# Patient Record
Sex: Male | Born: 1965 | Race: Black or African American | Hispanic: No | Marital: Married | Smoking: Never smoker
Health system: Southern US, Community
[De-identification: ages and names within clinical notes are randomized; demographics above are authoritative.]

## PROBLEM LIST (undated history)

## (undated) DIAGNOSIS — I10 Essential (primary) hypertension: Secondary | ICD-10-CM

## (undated) HISTORY — PX: COLONOSCOPY: SHX174

---

## 2000-01-11 ENCOUNTER — Emergency Department (HOSPITAL_COMMUNITY): Admission: EM | Admit: 2000-01-11 | Discharge: 2000-01-11 | Payer: Self-pay | Admitting: Emergency Medicine

## 2000-04-20 ENCOUNTER — Ambulatory Visit (HOSPITAL_COMMUNITY): Admission: RE | Admit: 2000-04-20 | Discharge: 2000-04-20 | Payer: Self-pay | Admitting: Internal Medicine

## 2001-03-15 ENCOUNTER — Emergency Department (HOSPITAL_COMMUNITY): Admission: EM | Admit: 2001-03-15 | Discharge: 2001-03-15 | Payer: Self-pay | Admitting: Emergency Medicine

## 2001-08-09 ENCOUNTER — Inpatient Hospital Stay (HOSPITAL_COMMUNITY): Admission: EM | Admit: 2001-08-09 | Discharge: 2001-08-09 | Payer: Self-pay | Admitting: Psychiatry

## 2001-08-10 ENCOUNTER — Emergency Department (HOSPITAL_COMMUNITY): Admission: EM | Admit: 2001-08-10 | Discharge: 2001-08-11 | Payer: Self-pay | Admitting: Emergency Medicine

## 2001-08-11 ENCOUNTER — Encounter: Payer: Self-pay | Admitting: Emergency Medicine

## 2001-11-04 ENCOUNTER — Emergency Department (HOSPITAL_COMMUNITY): Admission: EM | Admit: 2001-11-04 | Discharge: 2001-11-04 | Payer: Self-pay | Admitting: Emergency Medicine

## 2002-02-16 ENCOUNTER — Emergency Department (HOSPITAL_COMMUNITY): Admission: EM | Admit: 2002-02-16 | Discharge: 2002-02-16 | Payer: Self-pay | Admitting: Emergency Medicine

## 2002-05-21 ENCOUNTER — Ambulatory Visit (HOSPITAL_BASED_OUTPATIENT_CLINIC_OR_DEPARTMENT_OTHER): Admission: RE | Admit: 2002-05-21 | Discharge: 2002-05-21 | Payer: Self-pay | Admitting: Urology

## 2003-12-08 ENCOUNTER — Emergency Department (HOSPITAL_COMMUNITY): Admission: EM | Admit: 2003-12-08 | Discharge: 2003-12-08 | Payer: Self-pay | Admitting: Emergency Medicine

## 2004-01-04 ENCOUNTER — Emergency Department (HOSPITAL_COMMUNITY): Admission: EM | Admit: 2004-01-04 | Discharge: 2004-01-04 | Payer: Self-pay | Admitting: Emergency Medicine

## 2007-06-22 ENCOUNTER — Emergency Department (HOSPITAL_COMMUNITY): Admission: EM | Admit: 2007-06-22 | Discharge: 2007-06-22 | Payer: Self-pay | Admitting: Emergency Medicine

## 2011-04-21 NOTE — Discharge Summary (Signed)
[facility]  Patient:    Edward Mckinney, Edward Mckinney Visit Number: [id number] MRN: [id number]          Service Type: PSY Location: 50 [id number] Attending Physician:  Jeanice Lim Dictated by:   Young Berry Scott, N.P. Admit Date:  08/09/2001 Discharge Date: 08/09/2001                             Discharge Summary  This is both the psychiatric admission assessment and the discharge summary as the patient was admitted and discharged the same day.  DATE OF ASSESSMENT:  August 09, 2001 at 11 a.m.  IDENTIFYING INFORMATION:  This is a 45 year old African-American male, who is single.  He is an involuntary commitment after an accidental overdose of four Zoloft and four Excedrin.  HISTORY OF PRESENT ILLNESS:  The patient reports that he has recently been under stress, finding out about his fiances involvement with another man.  He was upset and crying yesterday and reports that he took an accidental overdose of four Zoloft that were not his own tablets and also four Excedrin over a 2-1/2 hour period in order to get himself calmed down and go to sleep.  He admits that he was upset about having to decide to postpone his wedding but he categorically denies that he had any suicidal thoughts, either at that time or now.  A friend had been talking to him on the phone and knew that he was upset and went over to his house to comfort him.  When the friend arrived, the friend discovered that he was a bit groggy.  The friend was unclear about what was happening and subsequently called EMS.  EMS stated that the patient had reported suicidal thoughts to them.  However, the patient, today, categorically denies this and insists that he has never had any suicidal thoughts at any time.  The patients fiance was also able to corroborate his story that he has never had any history of self-harm and has shown no signs of depression or expressed any suicidal thoughts.  The patient  denies any symptoms of depression, although he has had some difficulty getting to sleep. Recently, his appetite is good and he is functioning well at work.  PAST PSYCHIATRIC HISTORY:  The patient has no psychiatric history.  No history of self-harm behaviors.  No history of substance abuse.  SOCIAL HISTORY:  The patient is divorced.  He currently lives home alone.  He is working two jobs and was living with his fiance until recent change in their relationship.  Apparently, the patients fiance has become involved with another man, according to the record.  FAMILY HISTORY:  Unclear.  ALCOHOL/DRUG HISTORY:  None.  MEDICAL HISTORY:  The patient is followed by Dr. Karilyn Cota, who is his primary care physician.  The patient denies any medical problems.  MEDICATIONS:  None.  DRUG ALLERGIES:  None.  POSITIVE PHYSICAL FINDINGS:  The patient was seen and medically cleared in the [facility] and, at that time, it is noted that his drug screen was negative for any substances.  The patients hemoglobin and hematocrit and electrolytes were within normal limits.  His acetaminophen level was 17.3 and his salicylate level was less than 4.  His vital signs were within normal limits.  MENTAL STATUS EXAMINATION:  This is a pleasant and cooperative, African-American male, who is alert.  His affect is full range and appropriate.  Speech is normal in pace and tone and is relevant.  Mood is euthymic.  Thought process is logical and coherent.  There are no dangerous ideations present.  The patient is goal-oriented in getting back to work and fulfilling his responsibilities today.  No evidence delusions.  Cognitively, he is intact and oriented x 3.  Judgment is intact.  Insight is intact.  DIAGNOSES: Axis I:    Adjustment disorder with anxious features. Axis II:   None. Axis III:  None. Axis IV:   Deferred. Axis V:    Current 66; past year 59.  PLAN:  The patient has been evaluated and  we have determined that he is not in any way suicidal or homicidal.  He is able to categorically promise safety in the community.  The plan is to discharge him to home to himself.  There is no follow-up scheduled at this time.  There are no medications.  Follow-up is required.  The patient is instructed, at this time, to report to his primary care physician or to hospital emergency room if any thoughts of self-harm should occur or if he has any symptoms of depression or other problems occur. At this point, the patient is safe to be released to the community to his own responsibility.  The diagnoses, as already stated, are both admission and discharge diagnoses. Dictated by:   Young Berry Scott, N.P. Attending Physician:  Jeanice Lim DD:  08/09/01 TD:  08/09/01 Job: [id number]

## 2011-04-21 NOTE — Op Note (Signed)
[facility]  Patient:    ARISH, REDNER Visit Number: [id number] MRN: [id number]          Service Type: NES Location: NESC Attending Physician:  Katherine Roan Dictated by:   Rozanna Boer., M.D. Proc. Date: 05/21/02 Admit Date:  05/21/2002                             Operative Report  PREOPERATIVE DIAGNOSIS:  Deep bulbous urethral stricture.  POSTOPERATIVE DIAGNOSIS:  Deep bulbous urethral stricture.  OPERATION:  Visual internal urethrotomy.  ANESTHESIA:  General.  SURGEON:  Rozanna Boer., M.D.  BRIEF HISTORY:  This is a 45 year old black male who was admitted with terminal hematuria, none for a year.  Office cystoscopy showed a tight urethral stricture and a deep bulbous urethra and he enters for incision at this time.  There is no history of trauma as a child or history of venereal disease, and the etiology of the stricture is unclear.  DESCRIPTION OF PROCEDURE:  The patient was placed on the operating table in the dorsolithotomy position after satisfactory induction of general anesthesia.  He was prepped and draped with Betadine in the usual sterile fashion.  The visual urethrotome was passed down to the deep bulbous urethra where the stricture was seen and photographed.  A 3-French ureteral catheter went through this which just about filled up the lumen.  Then, with the cold knife, the stricture was opened at 12 oclock.  This was about 2 cm distal to the external sphincter.  Once the stricture was incised, I could pass the scope into the bladder without difficulty.  The bladder was inspected and found to be free of any mucosal lesions, mild trabeculated was noted.  Coming out, I incised a little bit more at 12 oclock.  The stricture was less than 1 cm in length and seemed to be wide open at the end of the procedure.  I removed the scope and passed a #22 Foley catheter without difficulty.  There was  no bleeding and the catheter was left to straight drainage and then attached to a leg bag.  A B&O suppository was inserted and the patient taken to the recovery room in good condition with detailed written instructions, and will have the catheter removed in one weeks time. Dictated by:   Rozanna Boer., M.D. Attending Physician:  Katherine Roan DD:  05/21/02 TD:  05/22/02 Job: [id number]

## 2011-04-21 NOTE — Discharge Summary (Signed)
[facility]  Patient:    Edward Mckinney, Edward Mckinney Visit Number: [id number] MRN: [id number]          Service Type: EMS Location: ED Attending Physician:  Shelba Flake Dictated by:   Jeanice Lim, M.D. Admit Date:  08/10/2001 Discharge Date: 08/11/2001                             Discharge Summary  NO DICTATION Dictated by:   Jeanice Lim, M.D. Attending Physician:  Shelba Flake DD:  09/18/01 TD:  09/19/01 Job: [id number]

## 2011-09-18 LAB — POCT CARDIAC MARKERS
CKMB, poc: <1 — ABNORMAL LOW
CKMB, poc: <1 — ABNORMAL LOW
Myoglobin, poc: 51.7
Operator id: 189501
Troponin i, poc: <0.05
Troponin i, poc: <0.05

## 2011-09-18 LAB — I-STAT 8, (EC8 V) (CONVERTED LAB)
Acid-base deficit: 1
Chloride: 106
HCT: 44
Operator id: 189501
TCO2: 25
pCO2, Ven: 38.8 — ABNORMAL LOW
pH, Ven: 7.392 — ABNORMAL HIGH

## 2012-12-16 ENCOUNTER — Encounter (HOSPITAL_COMMUNITY): Payer: Self-pay | Admitting: Emergency Medicine

## 2012-12-16 ENCOUNTER — Emergency Department (HOSPITAL_COMMUNITY)
Admission: EM | Admit: 2012-12-16 | Discharge: 2012-12-16 | Disposition: A | Discharge status: Home / Self Care | Payer: 59 | Attending: Emergency Medicine | Admitting: Emergency Medicine

## 2012-12-16 DIAGNOSIS — Z79899 Other long term (current) drug therapy: Secondary | ICD-10-CM | POA: Insufficient documentation

## 2012-12-16 DIAGNOSIS — D649 Anemia, unspecified: Secondary | ICD-10-CM

## 2012-12-16 DIAGNOSIS — R030 Elevated blood-pressure reading, without diagnosis of hypertension: Secondary | ICD-10-CM | POA: Insufficient documentation

## 2012-12-16 LAB — CBC WITH DIFFERENTIAL/PLATELET
Basophils Relative: 0 % (ref 0–1)
Eosinophils Relative: 4 % (ref 0–5)
Hemoglobin: 8.1 g/dL — ABNORMAL LOW (ref 13.0–17.0)
Lymphocytes Relative: 37 % (ref 12–46)
Monocytes Absolute: 0.4 10*3/uL (ref 0.1–1.0)
Monocytes Relative: 6 % (ref 3–12)
Neutrophils Relative %: 53 % (ref 43–77)
Platelets: 157 10*3/uL (ref 150–400)
RBC: 4.63 MIL/uL (ref 4.22–5.81)
WBC: 6.2 10*3/uL (ref 4.0–10.5)

## 2012-12-16 LAB — BASIC METABOLIC PANEL
CO2: 23 mEq/L (ref 19–32)
Calcium: 9.6 mg/dL (ref 8.4–10.5)
Chloride: 101 mEq/L (ref 96–112)
Potassium: 5.1 mEq/L (ref 3.5–5.1)
Sodium: 135 mEq/L (ref 135–145)

## 2012-12-16 LAB — URINALYSIS, ROUTINE W REFLEX MICROSCOPIC
Glucose, UA: NEGATIVE mg/dL
Leukocytes, UA: NEGATIVE
Protein, ur: NEGATIVE mg/dL
Specific Gravity, Urine: 1.015 (ref 1.005–1.030)

## 2012-12-16 NOTE — ED Notes (Signed)
Pt reports that his BP has been high for 3 weeks. 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 Pt has never been on BP meds. Reports intermittent headache in the past month. Pt in NAD. Skin warm and dry. Denies Chest Pain.

## 2012-12-16 NOTE — ED Provider Notes (Signed)
History  This chart was scribed for Arthor Captain, PA-C working with Richardean Canal, MD by Shari Heritage, ED Scribe. This patient was seen in room WTR5/WTR5 and the patient's care was started at 1621.   CSN: [id number]  Arrival date & time 12/16/12  1345   First MD Initiated Contact with Patient 12/16/12 1621      Chief Complaint  Patient presents with  . Headache     The history is provided by the patient. No language interpreter was used.    HPI Comments: Edward Mckinney is a 47 y.o. male who presents to the Emergency Department complaining of intermittent, mild to moderate, pressure-like headache behind the eyes onset a few weeks ago. Patient is also concerned about his blood pressure levels. Patient denies blurry vision, double vision or loss of peripheral vision. No chest pain or shortness of breath. Patient says that he is having difficulty reading fine print. He also states that he gets dizzy sometimes when headache pain comes on. 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 He then went to an urgent care center today prior to arrival and it measured 154/88. These measurements were taken from the same arm. Patient has never been diagnosed with hypertension and is not on any BP medications. He does not have a PCP and states that he usually gets check ups at the [place]. Family history of hypertension (mother and sister), diabetes (sister). Patient's family has no known history of stroke or heart attack.   History reviewed. No pertinent past medical history.  History reviewed. No pertinent past surgical history.  No family history on file.  History  Substance Use Topics  . Smoking status: Not on file  . Smokeless tobacco: Not on file  . Alcohol Use: Not on file      Review of Systems A complete 10 system review of systems was  obtained and all systems are negative except as noted in the HPI and PMH.   Allergies  Penicillins  Home Medications   Current Outpatient Rx  Name  Route  Sig  Dispense  Refill  . ADULT MULTIVITAMIN W/MINERALS CH   Oral   Take 1 tablet by mouth daily.         Marland Kitchen PHENYLEPHRINE-APAP-GUAIFENESIN 5-325-200 MG PO TABS   Oral   Take 2 tablets by mouth once.           Triage Vitals: BP 140/86  Pulse 64  Temp 98.3 F (36.8 C)  Resp 18  SpO2 98%  Physical Exam  Nursing note and vitals reviewed. Constitutional: He is oriented to person, place, and time. He appears well-developed and well-nourished. No distress.  HENT:  Head: Normocephalic and atraumatic.  Eyes: EOM are normal.  Neck: Neck supple. No tracheal deviation present.  Cardiovascular: Normal rate.   Pulmonary/Chest: Effort normal. No respiratory distress.  Musculoskeletal: Normal range of motion.  Neurological: He is alert and oriented to person, place, and time.  Skin: Skin is warm and dry.  Psychiatric: He has a normal mood and affect. His behavior is normal.    ED Course  Procedures (including critical care time) DIAGNOSTIC STUDIES: Oxygen Saturation is 98% on room air, normal by my interpretation.    COORDINATION OF CARE: 4:34 PM- Patient informed of current plan for treatment and evaluation and agrees with plan  at this time.    Labs Reviewed  CBC WITH DIFFERENTIAL - Abnormal; Notable for the following:    Hemoglobin 8.1 (*)     MCH 17.5 (*)     MCHC 20.7 (*)     All other components within normal limits  BASIC METABOLIC PANEL - Abnormal; Notable for the following:    Glucose, Bld 104 (*)     All other components within normal limits  URINALYSIS, ROUTINE W REFLEX MICROSCOPIC    No results found.   1. Anemia       MDM    4:59 PM Filed Vitals:   12/16/12 1518 12/16/12 1524 12/16/12 1525  BP: 131/87 133/91 140/86  Pulse: 64    Temp: 98.3 F (36.8 C)    Resp: 18    SpO2: 98%       Patient labs show anemia and a hemoglobin of 8.1.  Patient denies any dark or tarry stools.  He denies any blood in his stools.  He has been asymptomatic in terms of dizziness fatigue or cold intolerance.  Patient denies a history of peptic ulcer disease.  He has not been using excessive amounts of abs L. or aspirin or Goody powders.  Discussed the case with Dr. Silverio Lay who agrees that the patient for discharge at this time.  The patient will return for recheck of his CBC in one week.  He may return sooner if he does become symptomatic or notices dark tarry stool or blood.  Funduscopic exam of the patient's eyes reveal no arterial hemorrhages.  Patient visual acuity is 20/15 in both eyes. At this time there does not appear to be any evidence of an acute emergency medical condition and the patient appears stable for discharge with appropriate outpatient follow up.Diagnosis was discussed with patient who verbalizes understanding and is agreeable to discharge. Pt case discussed with Dr. Silverio Lay who agrees with my plan.   I personally performed the services described in this documentation, which was scribed in my presence. The recorded information has been reviewed and is accurate.     Arthor Captain, PA-C 12/17/12 1032

## 2012-12-17 NOTE — ED Provider Notes (Signed)
Medical screening examination/treatment/procedure(s) were performed by non-physician practitioner and as supervising physician I was immediately available for consultation/collaboration.   Richardean Canal, MD 12/17/12 [phone]

## 2014-07-26 ENCOUNTER — Emergency Department (HOSPITAL_COMMUNITY)
Admission: EM | Admit: 2014-07-26 | Discharge: 2014-07-26 | Disposition: A | Discharge status: Home / Self Care | Payer: 59 | Attending: Emergency Medicine | Admitting: Emergency Medicine

## 2014-07-26 ENCOUNTER — Encounter (HOSPITAL_COMMUNITY): Payer: Self-pay | Admitting: Emergency Medicine

## 2014-07-26 DIAGNOSIS — Z79899 Other long term (current) drug therapy: Secondary | ICD-10-CM | POA: Diagnosis not present

## 2014-07-26 DIAGNOSIS — Z880 Allergy status to penicillin: Secondary | ICD-10-CM | POA: Diagnosis not present

## 2014-07-26 DIAGNOSIS — K625 Hemorrhage of anus and rectum: Secondary | ICD-10-CM | POA: Diagnosis not present

## 2014-07-26 DIAGNOSIS — K921 Melena: Secondary | ICD-10-CM | POA: Diagnosis not present

## 2014-07-26 LAB — CBC WITH DIFFERENTIAL/PLATELET
BASOS PCT: 1 % (ref 0–1)
Basophils Absolute: 0 10*3/uL (ref 0.0–0.1)
EOS ABS: 0.1 10*3/uL (ref 0.0–0.7)
EOS PCT: 2 % (ref 0–5)
HCT: 41.3 % (ref 39.0–52.0)
HEMOGLOBIN: 14.2 g/dL (ref 13.0–17.0)
LYMPHS ABS: 1.9 10*3/uL (ref 0.7–4.0)
Lymphocytes Relative: 43 % (ref 12–46)
MCH: 29.4 pg (ref 26.0–34.0)
MCHC: 34.4 g/dL (ref 30.0–36.0)
MCV: 85.5 fL (ref 78.0–100.0)
MONO ABS: 0.3 10*3/uL (ref 0.1–1.0)
MONOS PCT: 7 % (ref 3–12)
NEUTROS PCT: 48 % (ref 43–77)
Neutro Abs: 2.1 10*3/uL (ref 1.7–7.7)
Platelets: 105 10*3/uL — ABNORMAL LOW (ref 150–400)
RBC: 4.83 MIL/uL (ref 4.22–5.81)
RDW: 12.5 % (ref 11.5–15.5)
WBC: 4.4 10*3/uL (ref 4.0–10.5)

## 2014-07-26 LAB — COMPREHENSIVE METABOLIC PANEL
ALBUMIN: 4.1 g/dL (ref 3.5–5.2)
ALT: 31 U/L (ref 0–53)
ANION GAP: 12 (ref 5–15)
AST: 32 U/L (ref 0–37)
Alkaline Phosphatase: 79 U/L (ref 39–117)
BUN: 12 mg/dL (ref 6–23)
CALCIUM: 9.3 mg/dL (ref 8.4–10.5)
CO2: 24 mEq/L (ref 19–32)
CREATININE: 1.09 mg/dL (ref 0.50–1.35)
Chloride: 104 mEq/L (ref 96–112)
GFR calc non Af Amer: 79 mL/min — ABNORMAL LOW (ref >90)
GLUCOSE: 99 mg/dL (ref 70–99)
POTASSIUM: 4.2 meq/L (ref 3.7–5.3)
Sodium: 140 mEq/L (ref 137–147)
TOTAL PROTEIN: 7.8 g/dL (ref 6.0–8.3)
Total Bilirubin: 0.8 mg/dL (ref 0.3–1.2)

## 2014-07-26 LAB — POC OCCULT BLOOD, ED: FECAL OCCULT BLD: POSITIVE — AB

## 2014-07-26 LAB — LIPASE, BLOOD: LIPASE: 27 U/L (ref 11–59)

## 2014-07-26 LAB — PROTIME-INR
INR: 0.9 (ref 0.00–1.49)
PROTHROMBIN TIME: 12.2 s (ref 11.6–15.2)

## 2014-07-26 NOTE — Discharge Instructions (Signed)
Bloody Stools  Bloody stools often mean that there is a problem in the digestive tract. Your caregiver may use the term "melena" to describe black, tarry, and bad smelling stools or "hematochezia" to describe red or maroon-colored stools. Blood seen in the stool can be caused by bleeding anywhere along the intestinal tract.   A black stool usually means that blood is coming from the upper part of the gastrointestinal tract (esophagus, stomach, or small bowel). Passing maroon-colored stools or bright red blood usually means that blood is coming from lower down in the large bowel or the rectum. However, sometimes massive bleeding in the stomach or small intestine can cause bright red bloody stools.   Consuming black licorice, lead, iron pills, medicines containing bismuth subsalicylate, or blueberries can also cause black stools. Your caregiver can test black stools to see if blood is present.  It is important that the cause of the bleeding be found. Treatment can then be started, and the problem can be corrected. Rectal bleeding may not be serious, but you should not assume everything is okay until you know the cause. It is very important to follow up with your caregiver or a specialist in gastrointestinal problems.  CAUSES   Blood in the stools can come from various underlying causes. Often, the cause is not found during your first visit. Testing is often needed to discover the cause of bleeding in the gastrointestinal tract. Causes range from simple to serious or even life-threatening. Possible causes include:  · Hemorrhoids. These are veins that are full of blood (engorged) in the rectum. They cause pain, inflammation, and may bleed.  · Anal fissures. These are areas of painful tearing which may bleed. They are often caused by passing hard stool.  · Diverticulosis. These are pouches that form on the colon over time, with age, and may bleed significantly.  · Diverticulitis. This is inflammation in areas with  diverticulosis. It can cause pain, fever, and bloody stools, although bleeding is rare.  · Proctitis and colitis. These are inflamed areas of the rectum or colon. They may cause pain, fever, and bloody stools.  · Polyps and cancer. Colon cancer is a leading cause of preventable cancer death. It often starts out as precancerous polyps that can be removed during a colonoscopy, preventing progression into cancer. Sometimes, polyps and cancer may cause rectal bleeding.  · Gastritis and ulcers. Bleeding from the upper gastrointestinal tract (near the stomach) may travel through the intestines and produce black, sometimes tarry, often bad smelling stools. In certain cases, if the bleeding is fast enough, the stools may not be black, but red and the condition may be life-threatening.  SYMPTOMS   You may have stools that are bright red and bloody, that are normal color with blood on them, or that are dark black and tarry. In some cases, you may only have blood in the toilet bowl. Any of these cases need medical care. You may also have:  · Pain at the anus or anywhere in the rectum.  · Lightheadedness or feeling faint.  · Extreme weakness.  · Nausea or vomiting.  · Fever.  DIAGNOSIS  Your caregiver may use the following methods to find the cause of your bleeding:  · Taking a medical history. Age is important. Older people tend to develop polyps and cancer more often. If there is anal pain and a hard, large stool associated with bleeding, a tear of the anus may be the cause. If blood drips into the toilet after a bowel movement, bleeding hemorrhoids may be the   problem. The color and frequency of the bleeding are additional considerations. In most cases, the medical history provides clues, but seldom the final answer.  · A visual and finger (digital) exam. Your caregiver will inspect the anal area, looking for tears and hemorrhoids. A finger exam can provide information when there is tenderness or a growth inside. In men, the  prostate is also examined.  · Endoscopy. Several types of small, long scopes (endoscopes) are used to view the colon.  ¨ In the office, your caregiver may use a rigid, or more commonly, a flexible viewing sigmoidoscope. This exam is called flexible sigmoidoscopy. It is performed in 5 to 10 minutes.  ¨ A more thorough exam is accomplished with a colonoscope. It allows your caregiver to view the entire 5 to 6 foot long colon. Medicine to help you relax (sedative) is usually given for this exam. Frequently, a bleeding lesion may be present beyond the reach of the sigmoidoscope. So, a colonoscopy may be the best exam to start with. Both exams are usually done on an outpatient basis. This means the patient does not stay overnight in the hospital or surgery center.  ¨ An upper endoscopy may be needed to examine your stomach. Sedation is used and a flexible endoscope is put in your mouth, down to your stomach.  · A barium enema X-ray. This is an X-ray exam. It uses liquid barium inserted by enema into the rectum. This test alone may not identify an actual bleeding point. X-rays highlight abnormal shadows, such as those made by lumps (tumors), diverticuli, or colitis.  TREATMENT   Treatment depends on the cause of your bleeding.   · For bleeding from the stomach or colon, the caregiver doing your endoscopy or colonoscopy may be able to stop the bleeding as part of the procedure.  · Inflammation or infection of the colon can be treated with medicines.  · Many rectal problems can be treated with creams, suppositories, or warm baths.  · Surgery is sometimes needed.  · Blood transfusions are sometimes needed if you have lost a lot of blood.  · For any bleeding problem, let your caregiver know if you take aspirin or other blood thinners regularly.  HOME CARE INSTRUCTIONS   · Take any medicines exactly as prescribed.  · Keep your stools soft by eating a diet high in fiber. Prunes (1 to 3 a day) work well for many people.  · Drink  enough water and fluids to keep your urine clear or pale yellow.  · Take sitz baths if advised. A sitz bath is when you sit in a bathtub with warm water for 10 to 15 minutes to soak, soothe, and cleanse the rectal area.  · If enemas or suppositories are advised, be sure you know how to use them. Tell your caregiver if you have problems with this.  · Monitor your bowel movements to look for signs of improvement or worsening.  SEEK MEDICAL CARE IF:   · You do not improve in the time expected.  · Your condition worsens after initial improvement.  · You develop any new symptoms.  SEEK IMMEDIATE MEDICAL CARE IF:   · You develop severe or prolonged rectal bleeding.  · You vomit blood.  · You feel weak or faint.  · You have a fever.  MAKE SURE YOU:  · Understand these instructions.  · Will watch your condition.  · Will get help right away if you are not doing well or get worse.    Document Released: 11/10/2002 Document Revised: 02/12/2012 Document Reviewed: 04/07/2011  [employer]® Patient Information ©2015 [employer]. This information is not intended to replace advice given to you by your health care provider. Make sure you discuss any questions you have with your health care provider.

## 2014-07-26 NOTE — ED Provider Notes (Signed)
CSN: [id number]     Arrival date & time 07/26/14  0912 History   First MD Initiated Contact with Patient 07/26/14 [phone]     Chief Complaint  Patient presents with  . Rectal Bleeding     (Consider location/radiation/quality/duration/timing/severity/associated sxs/prior Treatment) Patient is a 48 y.o. male presenting with hematochezia. The history is provided by the patient.  Rectal Bleeding Quality:  Bright red Amount: seen in bowl prior to flushing, also some brb on tissue paper. Duration: once today. Timing: once. Progression:  Unchanged Chronicity:  New Context: spontaneously   Similar prior episodes: no   Relieved by:  Nothing Worsened by:  Nothing tried Ineffective treatments:  None tried Associated symptoms: no abdominal pain, no fever and no vomiting     No past medical history on file. Past Surgical History  Procedure Laterality Date  . Colonoscopy     No family history on file. History  Substance Use Topics  . Smoking status: Never Smoker   . Smokeless tobacco: Not on file  . Alcohol Use: No    Review of Systems  Constitutional: Negative for fever.  HENT: Negative for drooling and rhinorrhea.   Eyes: Negative for pain.  Respiratory: Negative for cough and shortness of breath.   Cardiovascular: Negative for chest pain and leg swelling.  Gastrointestinal: Positive for hematochezia. Negative for nausea, vomiting, abdominal pain and diarrhea.  Genitourinary: Negative for dysuria and hematuria.       Brb in stool  Musculoskeletal: Negative for gait problem and neck pain.  Skin: Negative for color change.  Neurological: Negative for numbness and headaches.  Hematological: Negative for adenopathy.  Psychiatric/Behavioral: Negative for behavioral problems.  All other systems reviewed and are negative.     Allergies  Penicillins  Home Medications   Prior to Admission medications   Medication Sig Start Date End Date Taking? Authorizing Provider   acetaminophen (TYLENOL) 500 MG tablet Take 500 mg by mouth every 6 (six) hours as needed for mild pain.   Yes Historical Provider, MD  Multiple Vitamin (MULTIVITAMIN WITH MINERALS) TABS tablet Take 1 tablet by mouth daily.   Yes Historical Provider, MD  omeprazole (PRILOSEC) 20 MG capsule Take 20 mg by mouth daily.   Yes Historical Provider, MD   BP 156/91  Pulse 71  Temp(Src) 98.3 F (36.8 C) (Oral)  Resp 16  SpO2 97% Physical Exam  Nursing note and vitals reviewed. Constitutional: He is oriented to person, place, and time. He appears well-developed and well-nourished.  HENT:  Head: Normocephalic and atraumatic.  Right Ear: External ear normal.  Left Ear: External ear normal.  Nose: Nose normal.  Mouth/Throat: Oropharynx is clear and moist. No oropharyngeal exudate.  Eyes: Conjunctivae and EOM are normal. Pupils are equal, round, and reactive to light.  Neck: Normal range of motion. Neck supple.  Cardiovascular: Normal rate, regular rhythm, normal heart sounds and intact distal pulses.  Exam reveals no gallop and no friction rub.   No murmur heard. Pulmonary/Chest: Effort normal and breath sounds normal. No respiratory distress. He has no wheezes.  Abdominal: Soft. Bowel sounds are normal. He exhibits no distension. There is no tenderness. There is no rebound and no guarding.  Genitourinary:  Normal appearing external rectum. Small amount of dark red blood seen mixed in with brown stool.  Musculoskeletal: Normal range of motion. He exhibits no edema and no tenderness.  Neurological: He is alert and oriented to person, place, and time.  Skin: Skin is warm and dry.  Psychiatric: He  has a normal mood and affect. His behavior is normal.    ED Course  Procedures (including critical care time) Labs Review Labs Reviewed  CBC WITH DIFFERENTIAL - Abnormal; Notable for the following:    Platelets 105 (*)    All other components within normal limits  COMPREHENSIVE METABOLIC PANEL -  Abnormal; Notable for the following:    GFR calc non Af Amer 79 (*)    All other components within normal limits  POC OCCULT BLOOD, ED - Abnormal; Notable for the following:    Fecal Occult Bld POSITIVE (*)    All other components within normal limits  LIPASE, BLOOD  PROTIME-INR  OCCULT BLOOD X 1 CARD TO LAB, STOOL    Imaging Review No results found.   EKG Interpretation None      MDM   Final diagnoses:  Rectal bleeding    9:56 AM 48 y.o. male pw 1 episode of brb in a solid stool this morning. Pt reports colonoscopy/endoscopy 1-2 years ago w/ bleeding area near stomach? I do not have access to these records. Procedure was performed d/t anemia and lightheadedness per pt. AFVSS here, appears well on exam. On prilosec for gerd.   12:30 PM: I interpreted/reviewed the labs and/or imaging which were non-contributory.  Pt cont to appear well. No further episodes here. He is on a ppi.  I have discussed the diagnosis/risks/treatment options with the patient and family and believe the pt to be eligible for discharge home to follow-up with his Gi doctor tomorrow. We also discussed returning to the ED immediately if new or worsening sx occur. We discussed the sx which are most concerning (e.g., worsening bleeding, sob, fatigue, lightheadedness) that necessitate immediate return. Medications administered to the patient during their visit and any new prescriptions provided to the patient are listed below.  Medications given during this visit Medications - No data to display  New Prescriptions   No medications on file     Purvis Sheffield, MD 07/26/14 1231

## 2014-07-26 NOTE — ED Notes (Signed)
He states he had a b.m. This morning and noted some bright red blood in the stool with his b.m.  He subsequently re-wiped, and saw blood on the toilet paper.  He denies any pain or discomfort and is in no distress.

## 2021-03-25 ENCOUNTER — Encounter (HOSPITAL_COMMUNITY): Payer: Self-pay | Admitting: *Deleted

## 2021-03-25 ENCOUNTER — Emergency Department (HOSPITAL_COMMUNITY): Payer: No Typology Code available for payment source

## 2021-03-25 ENCOUNTER — Other Ambulatory Visit: Payer: Self-pay

## 2021-03-25 ENCOUNTER — Emergency Department (HOSPITAL_COMMUNITY)
Admission: EM | Admit: 2021-03-25 | Discharge: 2021-03-25 | Disposition: A | Discharge status: Home / Self Care | Payer: No Typology Code available for payment source | Attending: Emergency Medicine | Admitting: Emergency Medicine

## 2021-03-25 DIAGNOSIS — R0789 Other chest pain: Secondary | ICD-10-CM | POA: Diagnosis not present

## 2021-03-25 DIAGNOSIS — R079 Chest pain, unspecified: Secondary | ICD-10-CM

## 2021-03-25 DIAGNOSIS — I10 Essential (primary) hypertension: Secondary | ICD-10-CM | POA: Insufficient documentation

## 2021-03-25 LAB — CBC
HCT: 40.6 % (ref 39.0–52.0)
Hemoglobin: 13.9 g/dL (ref 13.0–17.0)
MCH: 29.3 pg (ref 26.0–34.0)
MCHC: 34.2 g/dL (ref 30.0–36.0)
MCV: 85.7 fL (ref 80.0–100.0)
Platelets: 236 10*3/uL (ref 150–400)
RBC: 4.74 MIL/uL (ref 4.22–5.81)
RDW: 12.3 % (ref 11.5–15.5)
WBC: 6.2 10*3/uL (ref 4.0–10.5)
nRBC: 0 % (ref 0.0–0.2)

## 2021-03-25 LAB — BASIC METABOLIC PANEL
Anion gap: 7 (ref 5–15)
BUN: 10 mg/dL (ref 6–20)
CO2: 23 mmol/L (ref 22–32)
Calcium: 9.1 mg/dL (ref 8.9–10.3)
Chloride: 107 mmol/L (ref 98–111)
Creatinine, Ser: 1.06 mg/dL (ref 0.61–1.24)
GFR, Estimated: >60 mL/min (ref >60)
Glucose, Bld: 96 mg/dL (ref 70–99)
Potassium: 4.1 mmol/L (ref 3.5–5.1)
Sodium: 137 mmol/L (ref 135–145)

## 2021-03-25 LAB — TROPONIN I (HIGH SENSITIVITY)
Troponin I (High Sensitivity): 6 ng/L (ref <18)
Troponin I (High Sensitivity): 8 ng/L (ref <18)

## 2021-03-25 MED ORDER — OMEPRAZOLE 20 MG PO CPDR: 20.0000 mg | DELAYED_RELEASE_CAPSULE | Freq: Every day | ORAL | 0 refills | Status: AC

## 2021-03-25 MED ORDER — METHOCARBAMOL 500 MG PO TABS: 500.0000 mg | ORAL_TABLET | Freq: Two times a day (BID) | ORAL | 0 refills | Status: AC

## 2021-03-25 MED ORDER — ACETAMINOPHEN 325 MG PO TABS
650.0000 mg | ORAL_TABLET | Freq: Once | ORAL | Status: AC
  Administered 2021-03-25: 650 mg via ORAL
  Filled 2021-03-25: qty 2

## 2021-03-25 MED ORDER — METHOCARBAMOL 500 MG PO TABS
500.0000 mg | ORAL_TABLET | Freq: Once | ORAL | Status: AC
  Administered 2021-03-25: 500 mg via ORAL
  Filled 2021-03-25: qty 1

## 2021-03-25 NOTE — ED Provider Notes (Signed)
[facility]-EMERGENCY DEPT Provider Note   CSN: [id number] Arrival date & time: 03/25/21  1155     History Chief Complaint  Patient presents with  . Chest Pain    Edward Mckinney is a 55 y.o. male.  HPI Patient is a 55 year old male presenting today with chest pain.  He denies any medical issues states he is not on any medications apart from omeprazole for reflux.   Patient states that his symptoms are around 5:20 AM this morning when he was on his way to work.  He states that it was a chest tightness that is not restricting his breathing.  He denies any shortness of breath, nausea, vomiting, diaphoresis lightheadedness or dizziness.  He states that he otherwise feels well.  Denies any radiation of the tightness.  States he has no history of heart disease.  Is not a smoker.  No first-degree relatives with heart attacks.  No history of diabetes.  He states he has a history of hypertension but that he has been taken off his blood pressure medications because his blood pressures been well.  Recent URI approximately 2 weeks ago.  He states that his chest pain is not positional however.  Also notes he worked out heavily recently.  HPI: A 55 year old patient with a history of obesity presents for evaluation of chest pain. Initial onset of pain was more than 6 hours ago. The patient's chest pain is described as heaviness/pressure/tightness and is not worse with exertion. The patient's chest pain is middle- or left-sided, is not well-localized, is not sharp and does not radiate to the arms/jaw/neck. The patient does not complain of nausea and denies diaphoresis. The patient has no history of stroke, has no history of peripheral artery disease, has not smoked in the past 90 days, denies any history of treated diabetes, has no relevant family history of coronary artery disease (first degree relative at less than age 74), is not hypertensive and has no history of  hypercholesterolemia.   History reviewed. No pertinent past medical history.  There are no problems to display for this patient.   Past Surgical History:  Procedure Laterality Date  . COLONOSCOPY         No family history on file.  Social History   Tobacco Use  . Smoking status: Never Smoker  . Smokeless tobacco: Never Used  Substance Use Topics  . Alcohol use: No  . Drug use: Never    Home Medications Prior to Admission medications   Medication Sig Start Date End Date Taking? Authorizing Provider  methocarbamol (ROBAXIN) 500 MG tablet Take 1 tablet (500 mg total) by mouth 2 (two) times daily. 03/25/21  Yes Taylour Lietzke, Stevphen Meuse S, PA  acetaminophen (TYLENOL) 500 MG tablet Take 500 mg by mouth every 6 (six) hours as needed for mild pain.    [provider]  Multiple Vitamin (MULTIVITAMIN WITH MINERALS) TABS tablet Take 1 tablet by mouth daily.    [provider]  omeprazole (PRILOSEC) 20 MG capsule Take 1 capsule (20 mg total) by mouth daily. 03/25/21   Gailen Shelter, PA    Allergies    Penicillins  Review of Systems   Review of Systems  Constitutional: Negative for chills and fever.  HENT: Negative for congestion.   Eyes: Negative for pain.  Respiratory: Positive for chest tightness. Negative for cough and shortness of breath.   Cardiovascular: Negative for leg swelling.  Gastrointestinal: Negative for abdominal pain, diarrhea, nausea and vomiting.  Genitourinary: Negative  for dysuria.  Musculoskeletal: Negative for myalgias.  Skin: Negative for rash.  Neurological: Negative for dizziness and headaches.    Physical Exam Updated Vital Signs BP 129/84   Pulse 73   Temp 98.6 F (37 C) (Oral)   Resp 11   Ht 6' (1.829 m)   Wt 108.9 kg   SpO2 97%   BMI 32.55 kg/m   Physical Exam Vitals and nursing note reviewed.  Constitutional:      General: He is not in acute distress.    Comments: Pleasant well-appearing 55 year old.  In no acute  distress.  Sitting comfortably in bed.  Able answer questions appropriately follow commands. No increased work of breathing. Speaking in full sentences.   HENT:     Head: Normocephalic and atraumatic.     Nose: Nose normal.     Mouth/Throat:     Mouth: Mucous membranes are moist.  Eyes:     General: No scleral icterus. Cardiovascular:     Rate and Rhythm: Normal rate and regular rhythm.     Pulses: Normal pulses.     Heart sounds: Normal heart sounds.  Pulmonary:     Effort: Pulmonary effort is normal. No respiratory distress.     Breath sounds: No wheezing.  Chest:     Chest wall: No tenderness.  Abdominal:     Palpations: Abdomen is soft.     Tenderness: There is no abdominal tenderness. There is no right CVA tenderness, left CVA tenderness, guarding or rebound.  Musculoskeletal:     Cervical back: Normal range of motion.     Right lower leg: No edema.     Left lower leg: No edema.     Comments: Lower extremity edema or swelling  Skin:    General: Skin is warm and dry.     Capillary Refill: Capillary refill takes less than 2 seconds.  Neurological:     Mental Status: He is alert. Mental status is at baseline.  Psychiatric:        Mood and Affect: Mood normal.        Behavior: Behavior normal.     ED Results / Procedures / Treatments   Labs (all labs ordered are listed, but only abnormal results are displayed) Labs Reviewed  BASIC METABOLIC PANEL  CBC  TROPONIN I (HIGH SENSITIVITY)  TROPONIN I (HIGH SENSITIVITY)    EKG None  Radiology DG Chest 2 View  Result Date: 03/25/2021 CLINICAL DATA:  Chest tightness EXAM: CHEST - 2 VIEW COMPARISON:  None. FINDINGS: The heart size and mediastinal contours are within normal limits. No focal airspace consolidation, pleural effusion, or pneumothorax. The visualized skeletal structures are unremarkable. IMPRESSION: No active cardiopulmonary disease. Electronically Signed   By: Duanne Guess D.O.   On: 03/25/2021 12:45     Procedures Procedures   Medications Ordered in ED Medications  methocarbamol (ROBAXIN) tablet 500 mg (500 mg Oral Given 03/25/21 1329)  acetaminophen (TYLENOL) tablet 650 mg (650 mg Oral Given 03/25/21 1329)    ED Course  I have reviewed the triage vital signs and the nursing notes.  Pertinent labs & imaging results that were available during my care of the patient were reviewed by me and considered in my medical decision making (see chart for details).  Patient with chest tightness since 4 AM this morning.  Has been nearly 12 hours now since onset. Physical exam is reassuring.  History is very atypical for ACS.  Patient is PERC negative.  Doubt pulmonary embolism.  Will  trend troponins and obtain chest x-ray, EKG, basic lab work.  The emergent causes of chest pain include: Acute coronary syndrome, tamponade, pericarditis/myocarditis, aortic dissection, pulmonary embolism, tension pneumothorax, pneumonia, and esophageal rupture.  I do not believe the patient has an emergent cause of chest pain, other urgent/non-acute considerations include, but are not limited to: chronic angina, aortic stenosis, cardiomyopathy, mitral valve prolapse, pulmonary hypertension, aortic insufficiency, right ventricular hypertrophy, pleuritis, bronchitis, pneumothorax, tumor, gastroesophageal reflux disease (GERD), esophageal spasm, Mallory-Weiss syndrome, peptic ulcer disease, pancreatitis, functional gastrointestinal pain, cervical or thoracic disk disease or arthritis, shoulder arthritis, costochondritis, subacromial bursitis, anxiety or panic attack, herpes zoster, breast disorders, chest wall tumors, thoracic outlet syndrome, mediastinitis.   Clinical Course as of 03/25/21 1451  Fri Mar 25, 2021  1440 CXR WNLs; NAD [WF]  1440 NSR, no ST-T wave abn [WF]    Clinical Course User Index [WF] Solon Augusta S, PA   BMP unremarkable, CBC unremarkable, troponin x2 within normal limits.  No significant  delta.  Chest x-ray unremarkable.  MDM Rules/Calculators/A&P HEAR Score: 3                        Patient reevaluated some improvement with Robaxin.  Will discharge home with omeprazole and Robaxin.  Return precautions given very strict.  We will also follow-up with PCP.  Final Clinical Impression(s) / ED Diagnoses Final diagnoses:  Chest pain, unspecified type    Rx / DC Orders ED Discharge Orders         Ordered    omeprazole (PRILOSEC) 20 MG capsule  Daily        03/25/21 1450    methocarbamol (ROBAXIN) 500 MG tablet  2 times daily        03/25/21 1450           Solon Augusta [place], [place] 03/25/21 1509    Margarita Grizzle, MD 03/28/21 1317

## 2021-03-25 NOTE — ED Triage Notes (Signed)
Pt developed left sided chest tightness this morning. He went to the [facility] in [place], EKG obtained. Pt was then told to come to the ED. No other symptoms noted.

## 2021-03-25 NOTE — Discharge Instructions (Addendum)
Your work-up today was reassuring.  Please follow-up with your primary care provider to continue to work-up your chest pain and symptoms today.  Given your heavy workout I do not think it is unreasonable to give you a muscle relaxer.  I prescribed you a muscle relaxer called Robaxin.  This may cause some mild drowsiness.  I have also prescribed you Prilosec.  I see that you have been on this in the past please take this once daily

## 2021-11-22 IMAGING — CR DG CHEST 2V
2 series · 2 of 2 positions shown · non-contrast
Comparison: None.

CLINICAL DATA: Chest tightness

EXAM:
CHEST - 2 VIEW

[w chest pa]
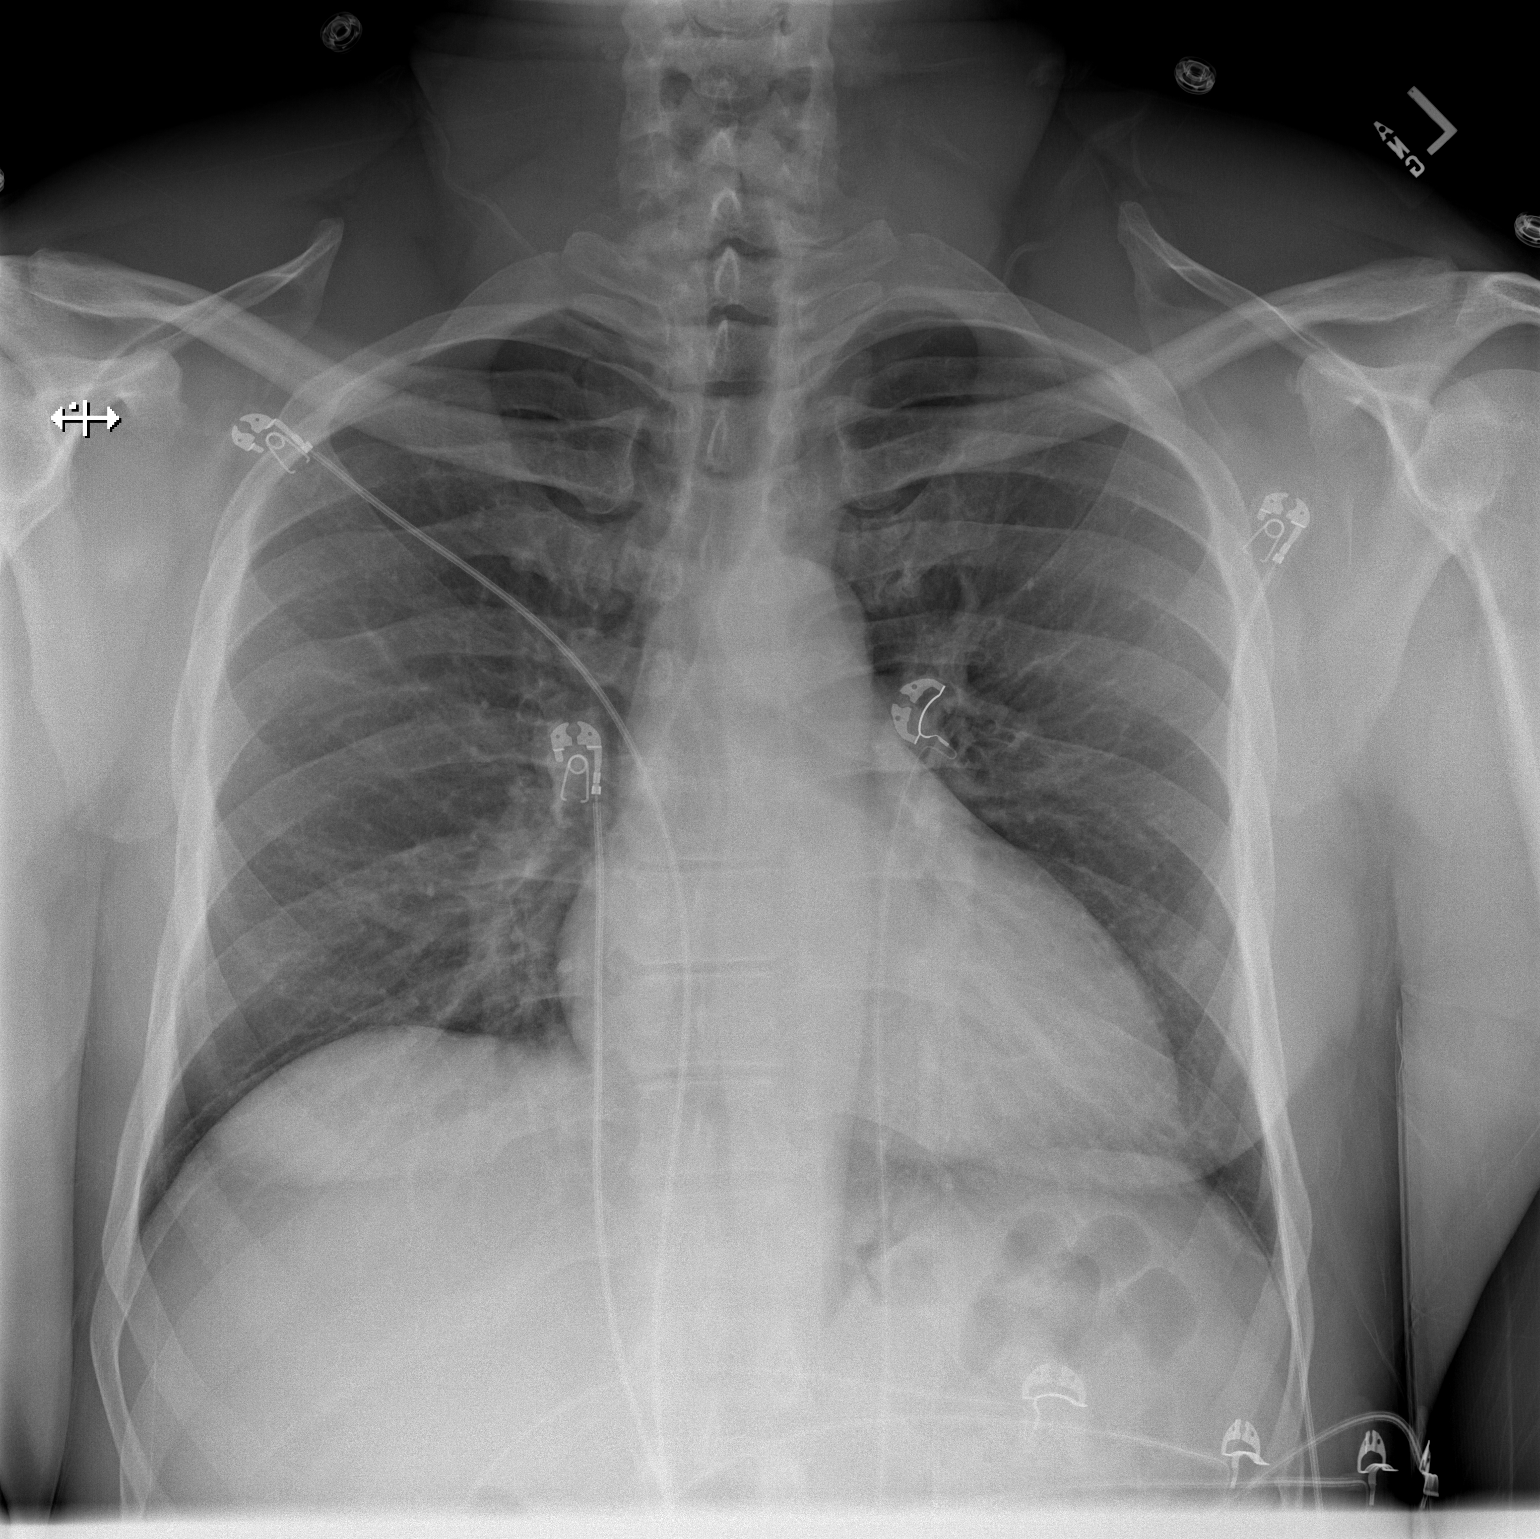

[w chest lat]
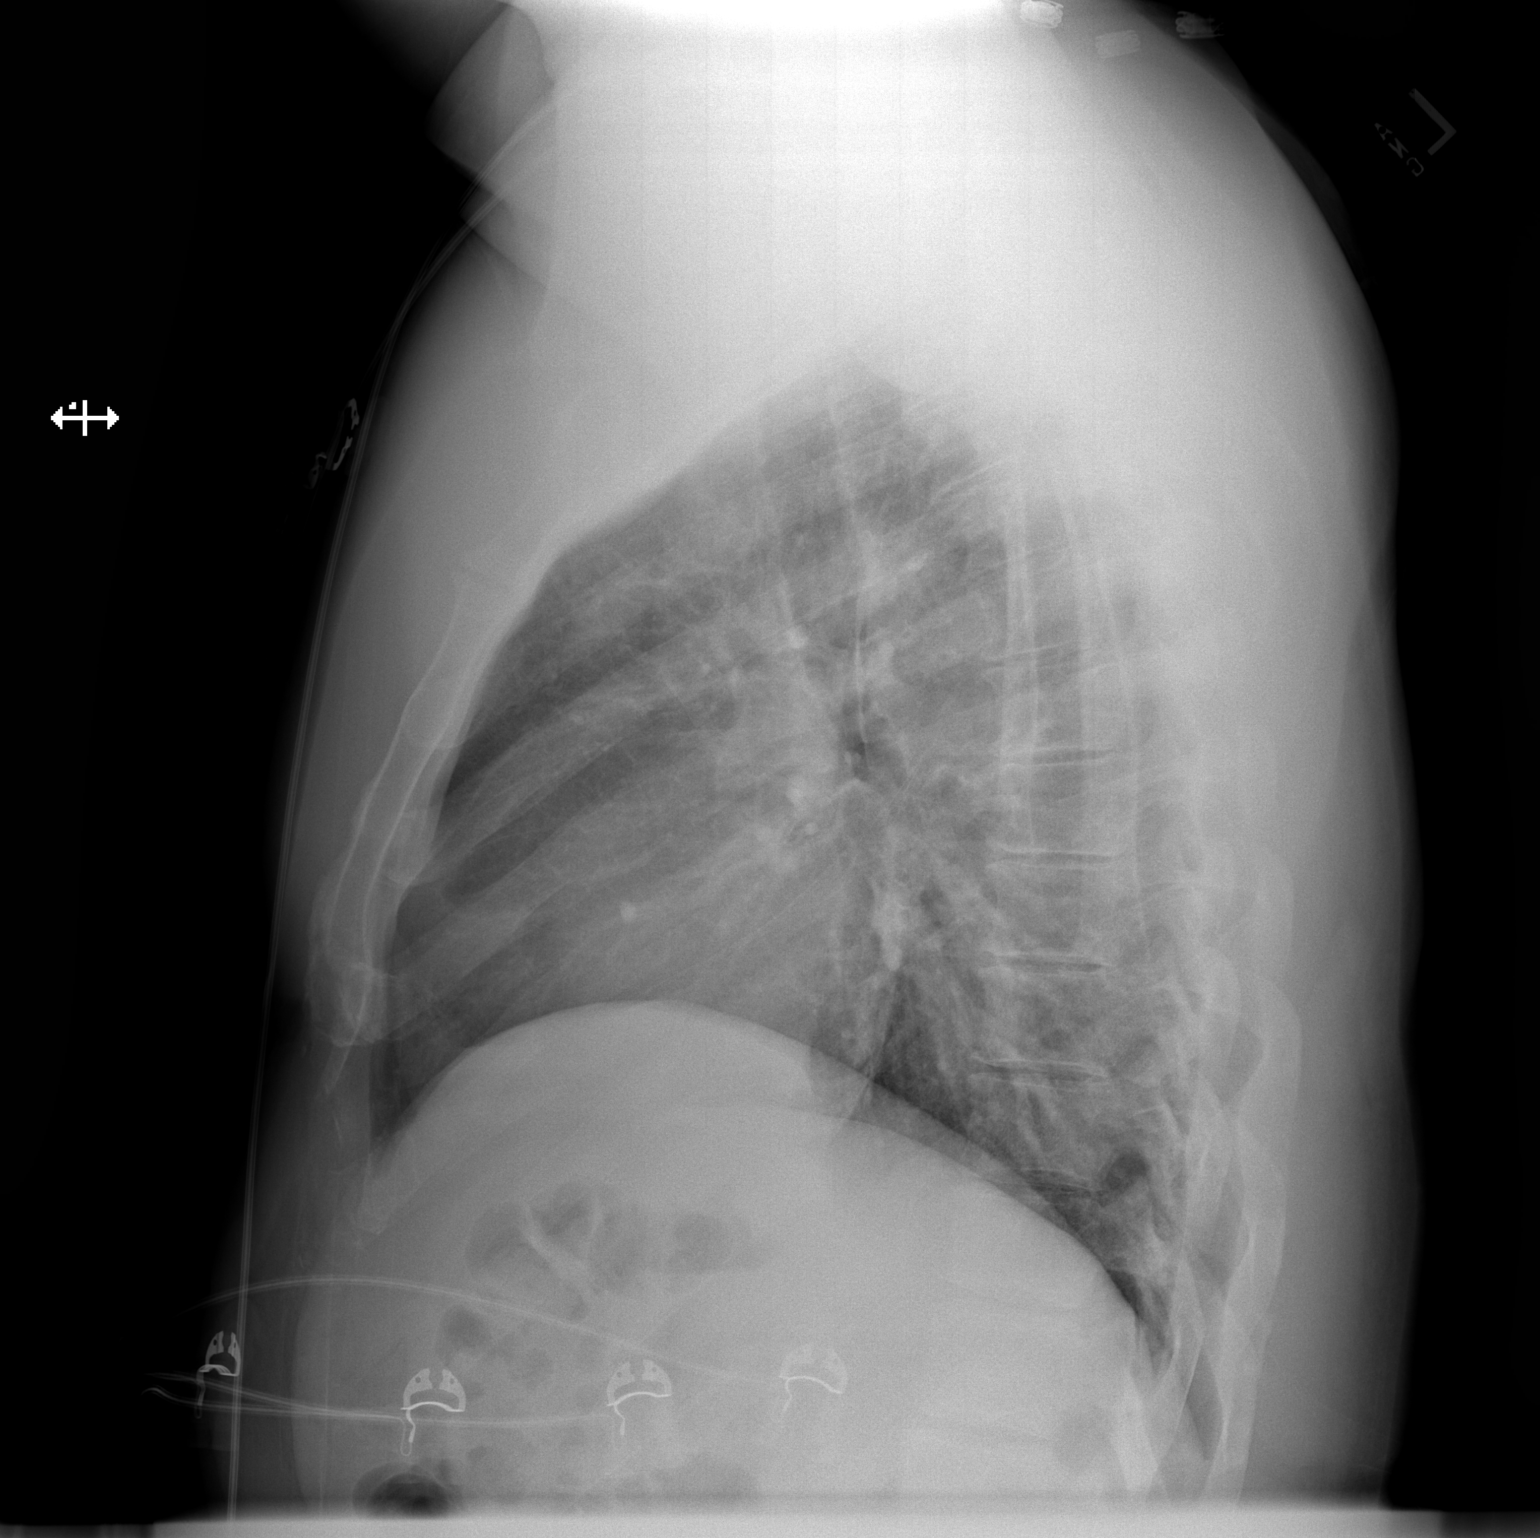

[2 of 2 positions shown; findings below may reference images not displayed]

FINDINGS: The heart size and mediastinal contours are within normal limits. No
focal airspace consolidation, pleural effusion, or pneumothorax. The
visualized skeletal structures are unremarkable.
IMPRESSION: No active cardiopulmonary disease.

## 2023-11-14 ENCOUNTER — Encounter (HOSPITAL_COMMUNITY): Payer: Self-pay

## 2023-11-14 ENCOUNTER — Emergency Department (HOSPITAL_COMMUNITY)
Admission: EM | Admit: 2023-11-14 | Discharge: 2023-11-14 | Disposition: A | Discharge status: Home / Self Care | Payer: No Typology Code available for payment source | Attending: Emergency Medicine | Admitting: Emergency Medicine

## 2023-11-14 ENCOUNTER — Other Ambulatory Visit: Payer: Self-pay

## 2023-11-14 DIAGNOSIS — I10 Essential (primary) hypertension: Secondary | ICD-10-CM | POA: Diagnosis not present

## 2023-11-14 DIAGNOSIS — R519 Headache, unspecified: Secondary | ICD-10-CM | POA: Diagnosis present

## 2023-11-14 DIAGNOSIS — G44219 Episodic tension-type headache, not intractable: Secondary | ICD-10-CM | POA: Insufficient documentation

## 2023-11-14 HISTORY — DX: Essential (primary) hypertension: I10

## 2023-11-14 LAB — I-STAT CHEM 8, ED
BUN: 11 mg/dL (ref 6–20)
Calcium, Ion: 1.23 mmol/L (ref 1.15–1.40)
Chloride: 107 mmol/L (ref 98–111)
Creatinine, Ser: 1.2 mg/dL (ref 0.61–1.24)
Glucose, Bld: 103 mg/dL — ABNORMAL HIGH (ref 70–99)
HCT: 40 % (ref 39.0–52.0)
Hemoglobin: 13.6 g/dL (ref 13.0–17.0)
Potassium: 4.2 mmol/L (ref 3.5–5.1)
Sodium: 142 mmol/L (ref 135–145)
TCO2: 22 mmol/L (ref 22–32)

## 2023-11-14 MED ORDER — ACETAMINOPHEN 500 MG PO TABS
1000.0000 mg | ORAL_TABLET | Freq: Once | ORAL | Status: AC
  Administered 2023-11-14: 1000 mg via ORAL
  Filled 2023-11-14: qty 2

## 2023-11-14 NOTE — ED Provider Notes (Signed)
[place] EMERGENCY DEPARTMENT AT [facility] Provider Note  CSN: [id number] Arrival date & time: 11/14/23 0507  Chief Complaint(s) Headache  HPI Edward Mckinney is a 57 y.o. male     Headache Pain location:  Frontal Quality:  Dull Onset quality:  Gradual Duration:  1 week Timing:  Intermittent Progression:  Waxing and waning Chronicity:  Recurrent Associated symptoms: fatigue   Associated symptoms: no abdominal pain, no blurred vision, no cough, no dizziness, no eye pain, no facial pain, no fever, no focal weakness, no hearing loss, no loss of balance, no nausea, no neck pain, no neck stiffness, no paresthesias, no photophobia and no vomiting    Patient reports similar episode 10 years ago where he was noted to have intermittent headaches and fatigue related to blood loss anemia from GI bleed.  He denies any hematochezia, melena, hematemesis.  Past Medical History Past Medical History:  Diagnosis Date   Hypertension    There are no problems to display for this patient.  Home Medication(s) Prior to Admission medications   Medication Sig Start Date End Date Taking? Authorizing Provider  acetaminophen (TYLENOL) 500 MG tablet Take 500 mg by mouth every 6 (six) hours as needed for mild pain.    [provider]  methocarbamol (ROBAXIN) 500 MG tablet Take 1 tablet (500 mg total) by mouth 2 (two) times daily. 03/25/21   Gailen Shelter, PA  Multiple Vitamin (MULTIVITAMIN WITH MINERALS) TABS tablet Take 1 tablet by mouth daily.    [provider]  omeprazole (PRILOSEC) 20 MG capsule Take 1 capsule (20 mg total) by mouth daily. 03/25/21   Gailen Shelter, PA                                                                                                                                    Allergies Penicillins  Review of Systems Review of Systems  Constitutional:  Positive for fatigue. Negative for fever.  HENT:  Negative for hearing loss.   Eyes:   Negative for blurred vision, photophobia and pain.  Respiratory:  Negative for cough.   Gastrointestinal:  Negative for abdominal pain, blood in stool, nausea and vomiting.  Musculoskeletal:  Negative for neck pain and neck stiffness.  Neurological:  Positive for headaches. Negative for dizziness, focal weakness, paresthesias and loss of balance.   As noted in HPI  Physical Exam Vital Signs  I have reviewed the triage vital signs BP (!) 155/99   Pulse 79   Temp 98.7 F (37.1 C) (Oral)   Resp 15   Ht 6' (1.829 m)   Wt 115.2 kg   SpO2 100%   BMI 34.45 kg/m   Physical Exam Vitals reviewed.  Constitutional:      General: He is not in acute distress.    Appearance: He is well-developed. He is not diaphoretic.  HENT:     Head: Normocephalic and atraumatic.  Nose: Nose normal.  Eyes:     General: No scleral icterus.       Right eye: No discharge.        Left eye: No discharge.     Conjunctiva/sclera: Conjunctivae normal.     Pupils: Pupils are equal, round, and reactive to light.  Cardiovascular:     Rate and Rhythm: Normal rate and regular rhythm.     Heart sounds: No murmur heard.    No friction rub. No gallop.  Pulmonary:     Effort: Pulmonary effort is normal. No respiratory distress.     Breath sounds: Normal breath sounds. No stridor. No rales.  Abdominal:     General: There is no distension.     Palpations: Abdomen is soft.     Tenderness: There is no abdominal tenderness.  Musculoskeletal:        General: No tenderness.     Cervical back: Normal range of motion and neck supple.  Skin:    General: Skin is warm and dry.     Findings: No erythema or rash.  Neurological:     Mental Status: He is alert and oriented to person, place, and time.     Cranial Nerves: Cranial nerves 2-12 are intact.     Sensory: Sensation is intact.     Motor: Motor function is intact.     Coordination: Coordination is intact.     Gait: Gait is intact.     ED Results and  Treatments Labs (all labs ordered are listed, but only abnormal results are displayed) Labs Reviewed  I-STAT CHEM 8, ED - Abnormal; Notable for the following components:      Result Value   Glucose, Bld 103 (*)    All other components within normal limits                                                                                                                         EKG  EKG Interpretation Date/Time:    Ventricular Rate:    PR Interval:    QRS Duration:    QT Interval:    QTC Calculation:   R Axis:      Text Interpretation:         Radiology No results found.  Medications Ordered in ED Medications  acetaminophen (TYLENOL) tablet 1,000 mg (1,000 mg Oral Given 11/14/23 1610)   Procedures Procedures  (including critical care time) Medical Decision Making / ED Course   Medical Decision Making Risk OTC drugs.     Non focal neuro exam.  No fever. Doubt meningitis.  Doubt IIH. No recent head trauma. Doubt intracranial bleed.  No indication for imaging.  Will treat with Tylenol and reevaluate.  Istat che 8 with normal electrolytes, renal function and hemoglobin.  Pain improved with Tylenol     Final Clinical Impression(s) / ED Diagnoses Final diagnoses:  Episodic tension-type headache, not intractable   The patient appears reasonably screened and/or stabilized for discharge and  I doubt any other medical condition or other [facility] requiring further screening, evaluation, or treatment in the ED at this time. I have discussed the findings, Dx and Tx plan with the patient/family who expressed understanding and agree(s) with the plan. Discharge instructions discussed at length. The patient/family was given strict return precautions who verbalized understanding of the instructions. No further questions at time of discharge.  Disposition: Discharge  Condition: Good  ED Discharge Orders     None        Follow Up: [facility], [facility] [address] [facility] [place] [address] [phone]  Call  to schedule an appointment for close follow up     This chart was dictated using voice recognition software.  Despite best efforts to proofread,  errors can occur which can change the documentation meaning.    Nira Conn, MD 11/14/23 [phone]

## 2023-11-14 NOTE — ED Triage Notes (Signed)
Pt reports that he has had increased fatigue and pressure behind his eyes x 1 week. Denies visual disturbance,
